# Patient Record
Sex: Male | Born: 1985 | Race: Black or African American | Hispanic: No | Marital: Single | Smoking: Never smoker
Health system: Southern US, Community
[De-identification: ages and names within clinical notes are randomized; demographics above are authoritative.]

---

## 2019-07-20 ENCOUNTER — Other Ambulatory Visit: Payer: Self-pay

## 2019-07-20 ENCOUNTER — Encounter (HOSPITAL_COMMUNITY): Payer: Self-pay | Admitting: Emergency Medicine

## 2019-07-20 ENCOUNTER — Emergency Department (HOSPITAL_COMMUNITY)
Admission: EM | Admit: 2019-07-20 | Discharge: 2019-07-20 | Disposition: A | Discharge status: Home / Self Care | Payer: BLUE CROSS/BLUE SHIELD | Attending: Emergency Medicine | Admitting: Emergency Medicine

## 2019-07-20 ENCOUNTER — Emergency Department (HOSPITAL_COMMUNITY): Payer: BLUE CROSS/BLUE SHIELD

## 2019-07-20 DIAGNOSIS — R0602 Shortness of breath: Secondary | ICD-10-CM | POA: Insufficient documentation

## 2019-07-20 DIAGNOSIS — R0789 Other chest pain: Secondary | ICD-10-CM | POA: Insufficient documentation

## 2019-07-20 DIAGNOSIS — R079 Chest pain, unspecified: Secondary | ICD-10-CM

## 2019-07-20 LAB — D-DIMER, QUANTITATIVE: D-Dimer, Quant: <0.27 ug/mL-FEU (ref 0.00–0.50)

## 2019-07-20 LAB — BASIC METABOLIC PANEL
Anion gap: 7 (ref 5–15)
BUN: 12 mg/dL (ref 6–20)
CO2: 25 mmol/L (ref 22–32)
Calcium: 9.7 mg/dL (ref 8.9–10.3)
Chloride: 107 mmol/L (ref 98–111)
Creatinine, Ser: 0.98 mg/dL (ref 0.61–1.24)
GFR calc Af Amer: >60 mL/min (ref >60)
GFR calc non Af Amer: >60 mL/min (ref >60)
Glucose, Bld: 98 mg/dL (ref 70–99)
Potassium: 4.1 mmol/L (ref 3.5–5.1)
Sodium: 139 mmol/L (ref 135–145)

## 2019-07-20 LAB — TROPONIN I (HIGH SENSITIVITY)
Troponin I (High Sensitivity): 6 ng/L (ref <18)
Troponin I (High Sensitivity): 6 ng/L (ref <18)

## 2019-07-20 LAB — CBC
HCT: 41.2 % (ref 39.0–52.0)
Hemoglobin: 14.3 g/dL (ref 13.0–17.0)
MCH: 30.4 pg (ref 26.0–34.0)
MCHC: 34.7 g/dL (ref 30.0–36.0)
MCV: 87.5 fL (ref 80.0–100.0)
Platelets: 264 10*3/uL (ref 150–400)
RBC: 4.71 MIL/uL (ref 4.22–5.81)
RDW: 14.1 % (ref 11.5–15.5)
WBC: 7.8 10*3/uL (ref 4.0–10.5)
nRBC: 0 % (ref 0.0–0.2)

## 2019-07-20 MED ORDER — IBUPROFEN 800 MG PO TABS: 800.0000 mg | ORAL_TABLET | Freq: Three times a day (TID) | ORAL | 0 refills | Status: AC | PRN

## 2019-07-20 MED ORDER — SODIUM CHLORIDE 0.9% FLUSH: 3.0000 mL | Freq: Once | INTRAVENOUS | Status: DC

## 2019-07-20 NOTE — ED Notes (Signed)
Blue, lt and dr green, lavender sent to lab.

## 2019-07-20 NOTE — ED Notes (Signed)
Patient transported to X-ray 

## 2019-07-20 NOTE — ED Notes (Signed)
Patient verbalizes understanding of discharge instructions. Opportunity for questioning and answers were provided. Armband removed by staff, pt discharged from ED.  

## 2019-07-20 NOTE — ED Triage Notes (Signed)
Pt here for evaluation of central chest pain with shob x 1 week. Pt sts pain is worse with exertion but still feels it intermittently when at rest.

## 2019-07-20 NOTE — ED Provider Notes (Signed)
[facility] EMERGENCY DEPARTMENT Provider Note   CSN: [id number] Arrival date & time: 07/20/19  1331     History   Chief Complaint Chief Complaint  Patient presents with  . Chest Pain    HPI Dennis Mullins is a 33 y.o. male.     HPI patient presents to the emergency department with chest pain is been ongoing for the last week.  Patient states he has had shortness of breath is been ongoing for quite a while.  The patient states that the chest pain is worse with certain movemechest pain, shortness of breath,nts.  Patient states did not take any medications prior to arrival for his symptoms.  The patient denies  headache,blurred vision, neck pain, fever, cough, weakness, numbness, dizziness, anorexia, edema, abdominal pain, nausea, vomiting, diarrhea, rash, back pain, dysuria, hematemesis, bloody stool, near syncope, or syncope. History reviewed. No pertinent past medical history.  There are no active problems to display for this patient.   History reviewed. No pertinent surgical history.      Home Medications    Prior to Admission medications   Not on File    Family History No family history on file.  Social History Social History   Tobacco Use  . Smoking status: Not on file  Substance Use Topics  . Alcohol use: Not on file  . Drug use: Not on file     Allergies   Patient has no known allergies.   Review of Systems Review of Systems All other systems negative except as documented in the HPI. All pertinent positives and negatives as reviewed in the HPI.  Physical Exam Updated Vital Signs BP 114/77   Pulse (!) 58   Temp 98.4 F (36.9 C) (Oral)   Resp 18   SpO2 99%   Physical Exam Vitals signs and nursing note reviewed.  Constitutional:      General: He is not in acute distress.    Appearance: He is well-developed.  HENT:     Head: Normocephalic and atraumatic.  Eyes:     Pupils: Pupils are equal, round, and reactive to light.   Neck:     Musculoskeletal: Normal range of motion and neck supple.  Cardiovascular:     Rate and Rhythm: Normal rate and regular rhythm.     Heart sounds: Normal heart sounds. No murmur. No friction rub. No gallop.   Pulmonary:     Effort: Pulmonary effort is normal. No respiratory distress.     Breath sounds: Normal breath sounds. No wheezing, rhonchi or rales.  Abdominal:     General: Bowel sounds are normal. There is no distension.     Palpations: Abdomen is soft.     Tenderness: There is no abdominal tenderness.  Skin:    General: Skin is warm and dry.     Capillary Refill: Capillary refill takes less than 2 seconds.     Findings: No erythema or rash.  Neurological:     Mental Status: He is alert and oriented to person, place, and time.     Motor: No abnormal muscle tone.     Coordination: Coordination normal.  Psychiatric:        Behavior: Behavior normal.      ED Treatments / Results  Labs (all labs ordered are listed, but only abnormal results are displayed) Labs Reviewed  BASIC METABOLIC PANEL  CBC  D-DIMER, QUANTITATIVE (NOT AT [facility])  TROPONIN I (HIGH SENSITIVITY)  TROPONIN I (HIGH SENSITIVITY)    EKG EKG  Interpretation  Date/Time:  Sunday July 20 2019 14:20:20 EDT Ventricular Rate:  76 PR Interval:    QRS Duration: 93 QT Interval:  445 QTC Calculation: 501 R Axis:   41 Text Interpretation:  Sinus rhythm Consider right atrial enlargement Consider right ventricular hypertrophy LVH by voltage ST elev, probable normal early repol pattern Prolonged QT interval Confirmed by Kennis CarinaBero, Michael [phone]([id number]) on 07/20/2019 2:54:53 PM   Radiology Dg Chest 2 View  Result Date: 07/20/2019 CLINICAL DATA:  Chest pain, shortness of breath EXAM: CHEST - 2 VIEW COMPARISON:  None. FINDINGS: The heart size and mediastinal contours are within normal limits. Both lungs are clear. The visualized skeletal structures are unremarkable. IMPRESSION: No acute abnormality of the lungs.  Electronically Signed   By: Lauralyn PrimesAlex  Bibbey M.D.   On: 07/20/2019 14:50    Procedures Procedures (including critical care time)  Medications Ordered in ED Medications  sodium chloride flush (NS) 0.9 % injection 3 mL (3 mLs Intravenous Not Given 07/20/19 1441)     Initial Impression / Assessment and Plan / ED Course  I have reviewed the triage vital signs and the nursing notes.  Pertinent labs & imaging results that were available during my care of the patient were reviewed by me and considered in my medical decision making (see chart for details).        The patient has atypical symptoms for cardiac chest pain.  Patient states that certain movements make his condition worse.  I feel that this is more pleuritic chest wall type pain.  Patient's d-dimer was negative which reassures us that there most likely is not a blood clot in his lung.  Patient also had 2- high-sensitivity troponins.  The patient's EKG does not show any acute changes as it was reviewed with cardiology earlier by the provider.  The patient is advised to return here as needed.  Patient denies any smoking history or cardiac type history in the past.  Final Clinical Impressions(s) / ED Diagnoses   Final diagnoses:  None    ED Discharge Orders    None       Charlestine NightLawyer, Errica Dutil, Cordelia Poche-C 07/20/19 1921    Tegeler, Canary Brimhristopher J, MD 07/21/19 0003

## 2019-07-20 NOTE — Discharge Instructions (Addendum)
Return here as needed.  Your testing here today did not show any significant abnormality.  Follow-up with your primary doctor.

## 2019-07-20 NOTE — ED Provider Notes (Signed)
MSE was initiated and I personally evaluated the patient and placed orders (if any) at  2:55 PM on July 20, 2019.  Patient here for chest pain x1 week, reports this is intermittent and worse with movement better with rest.  Has taken some ibuprofen without improvement.  Also endorses shortness of breath, this is worse with movement along with ambulation.  Reports that sitting at 90 degrees makes his pain worse.  No recent travel, sick contacts, denies any previous history of blood clots.  Patient does not have a history of CAD, diabetes, does not have any history of drug use or previous echo.  Physical Exam  Constitutional: He is oriented to person, place, and time. No distress.  HENT:  Head: Normocephalic and atraumatic.  Eyes: Pupils are equal, round, and reactive to light.  Neck: Normal range of motion. Neck supple.  Cardiovascular: Normal rate.  Pulmonary/Chest: Effort normal and breath sounds normal. He has no wheezes.  Abdominal: Soft. Bowel sounds are normal. There is no abdominal tenderness. There is no rebound and no guarding.  Neurological: He is oriented to person, place, and time.  Skin: Skin is warm and dry.  Nursing note and vitals reviewed.    The patient appears stable so that the remainder of the MSE may be completed by another provider.   Janeece Fitting, PA-C 07/20/19 1517    Maudie Flakes, MD 07/21/19 1655

## 2020-09-07 ENCOUNTER — Ambulatory Visit (INDEPENDENT_AMBULATORY_CARE_PROVIDER_SITE_OTHER): Payer: 59

## 2020-09-07 ENCOUNTER — Ambulatory Visit
Admission: EM | Admit: 2020-09-07 | Discharge: 2020-09-07 | Disposition: A | Discharge status: Home / Self Care | Payer: 59 | Attending: Emergency Medicine | Admitting: Emergency Medicine

## 2020-09-07 DIAGNOSIS — M79642 Pain in left hand: Secondary | ICD-10-CM | POA: Diagnosis not present

## 2020-09-07 DIAGNOSIS — S62613A Displaced fracture of proximal phalanx of left middle finger, initial encounter for closed fracture: Secondary | ICD-10-CM

## 2020-09-07 DIAGNOSIS — M545 Low back pain, unspecified: Secondary | ICD-10-CM

## 2020-09-07 NOTE — ED Provider Notes (Signed)
[facility]    CSN: [id number] Arrival date & time: 09/07/20  1836      History   Chief Complaint Chief Complaint  Patient presents with  . Finger Injury  . Back Pain    HPI Dennis Mullins is a 34 y.o. male  Presenting for left middle finger pain and swelling s/p fall that occurred Saturday.  Was playing flag football when a man landed on.  Endorsing mild bilateral low back pain that does not radiate as well.  No saddle anesthesia, lower leg numbness or weakness, urinary retention or fecal incontinence.  History reviewed. No pertinent past medical history.  There are no problems to display for this patient.   History reviewed. No pertinent surgical history.     Home Medications    Prior to Admission medications   Medication Sig Start Date End Date Taking? Authorizing Provider  ibuprofen (ADVIL) 800 MG tablet Take 1 tablet (800 mg total) by mouth every 8 (eight) hours as needed. 07/20/19   Charlestine Night, PA-C    Family History History reviewed. No pertinent family history.  Social History Social History   Tobacco Use  . Smoking status: Never Smoker  . Smokeless tobacco: Never Used  Substance Use Topics  . Alcohol use: Not Currently  . Drug use: Not Currently     Allergies   Patient has no known allergies.   Review of Systems Review of Systems  Constitutional: Negative for fatigue and fever.  Respiratory: Negative for cough and shortness of breath.   Cardiovascular: Negative for chest pain and palpitations.  Musculoskeletal:       Left middle finger pain, back pain.  Neurological: Negative for weakness and numbness.     Physical Exam Triage Vital Signs ED Triage Vitals  Enc Vitals Group     BP 09/07/20 1934 127/84     Pulse Rate 09/07/20 1934 61     Resp 09/07/20 1934 18     Temp 09/07/20 1934 98.4 F (36.9 C)     Temp Source 09/07/20 1934 Oral     SpO2 09/07/20 1934 97 %     Weight --      Height --      Head  Circumference --      Peak Flow --      Pain Score 09/07/20 1940 5     Pain Loc --      Pain Edu? --      Excl. in GC? --    No data found.  Updated Vital Signs BP 127/84 (BP Location: Left Arm)   Pulse 61   Temp 98.4 F (36.9 C) (Oral)   Resp 18   SpO2 97%   Visual Acuity Right Eye Distance:   Left Eye Distance:   Bilateral Distance:    Right Eye Near:   Left Eye Near:    Bilateral Near:     Physical Exam Constitutional:      General: He is not in acute distress. HENT:     Head: Normocephalic and atraumatic.  Eyes:     General: No scleral icterus.    Pupils: Pupils are equal, round, and reactive to light.  Cardiovascular:     Rate and Rhythm: Normal rate.  Pulmonary:     Effort: Pulmonary effort is normal. No respiratory distress.     Breath sounds: No wheezing.  Musculoskeletal:        General: Tenderness present. No swelling. Normal range of motion.     Comments: Left  hand, third MCP and extending into hand.  Neurovascularly intact. Diffuse lumbar tenderness without bony deformity, tenderness.  Negative SLR bilaterally.  Neurovascular intact  Skin:    Coloration: Skin is not jaundiced or pale.  Neurological:     Mental Status: He is alert and oriented to person, place, and time.      UC Treatments / Results  Labs (all labs ordered are listed, but only abnormal results are displayed) Labs Reviewed - No data to display  EKG   Radiology DG Hand Complete Left  Result Date: 09/07/2020 CLINICAL DATA:  Pain EXAM: LEFT HAND - COMPLETE 3+ VIEW COMPARISON:  None. FINDINGS: There is an acute, minimally displaced fracture through the head of the third metacarpal. There is surrounding soft tissue swelling. There is no dislocation. IMPRESSION: Acute, minimally displaced fracture through the head of the third metacarpal with surrounding soft tissue swelling. Electronically Signed   By: Katherine Mantle M.D.   On: 09/07/2020 19:57    Procedures Procedures  (including critical care time)  Medications Ordered in UC Medications - No data to display  Initial Impression / Assessment and Plan / UC Course  I have reviewed the triage vital signs and the nursing notes.  Pertinent labs & imaging results that were available during my care of the patient were reviewed by me and considered in my medical decision making (see chart for details).     XR significant for acute, minimally displaced fracture through the head of the third metacarpal with surrounding soft tissue swelling.  Patient agreeable with finger static splint, buddy taping, following up with Ortho tomorrow morning.  Return precautions discussed, pt verbalized understanding and is agreeable to plan. Final Clinical Impressions(s) / UC Diagnoses   Final diagnoses:  Acute bilateral low back pain without sciatica  Closed displaced fracture of proximal phalanx of left middle finger, initial encounter   Discharge Instructions   None    ED Prescriptions    None     PDMP not reviewed this encounter.   Hall-Potvin, [place], [place] 09/07/20 2030

## 2020-09-07 NOTE — ED Triage Notes (Signed)
Pt states playing flag football on Saturday and a man landed on him. Pt c/o jammed lt middle finger and lower back pain. Denies pain radiating or loss of bowel or bladder.

## 2022-02-06 IMAGING — DX DG HAND COMPLETE 3+V*L*
3 series · 3 of 3 positions shown · non-contrast
Comparison: None.

CLINICAL DATA: Pain

EXAM:
LEFT HAND - COMPLETE 3+ VIEW

[hand pa]
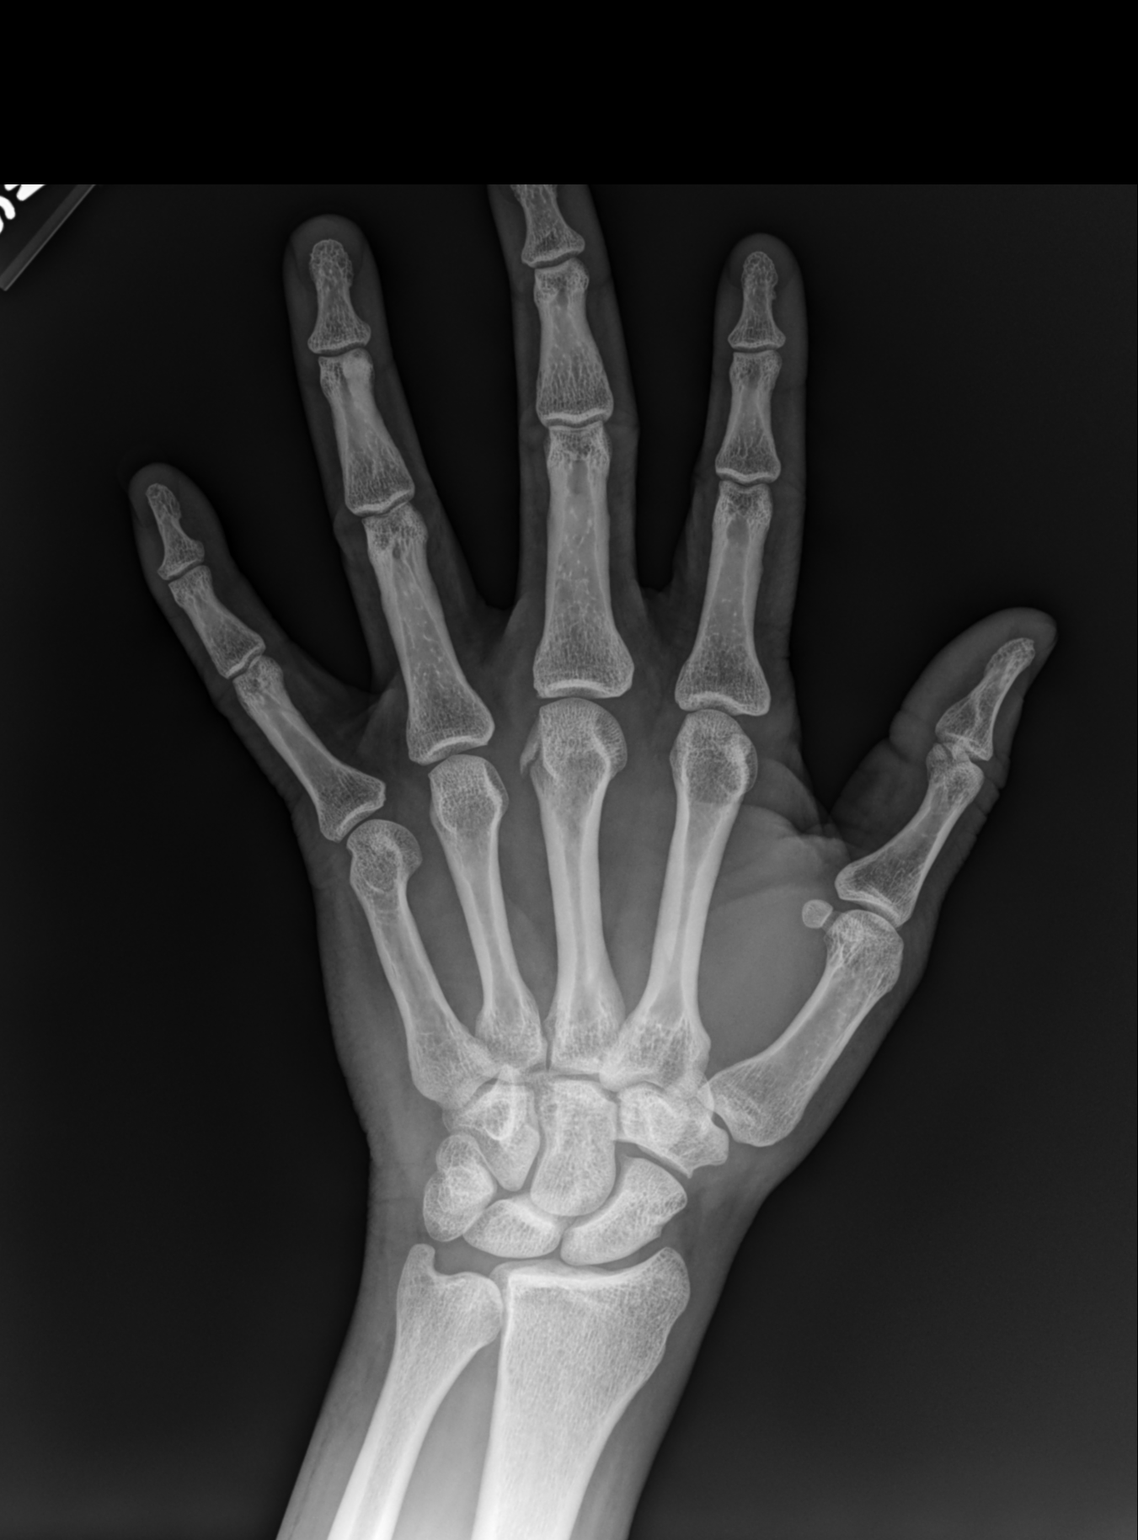

[hand mlo]
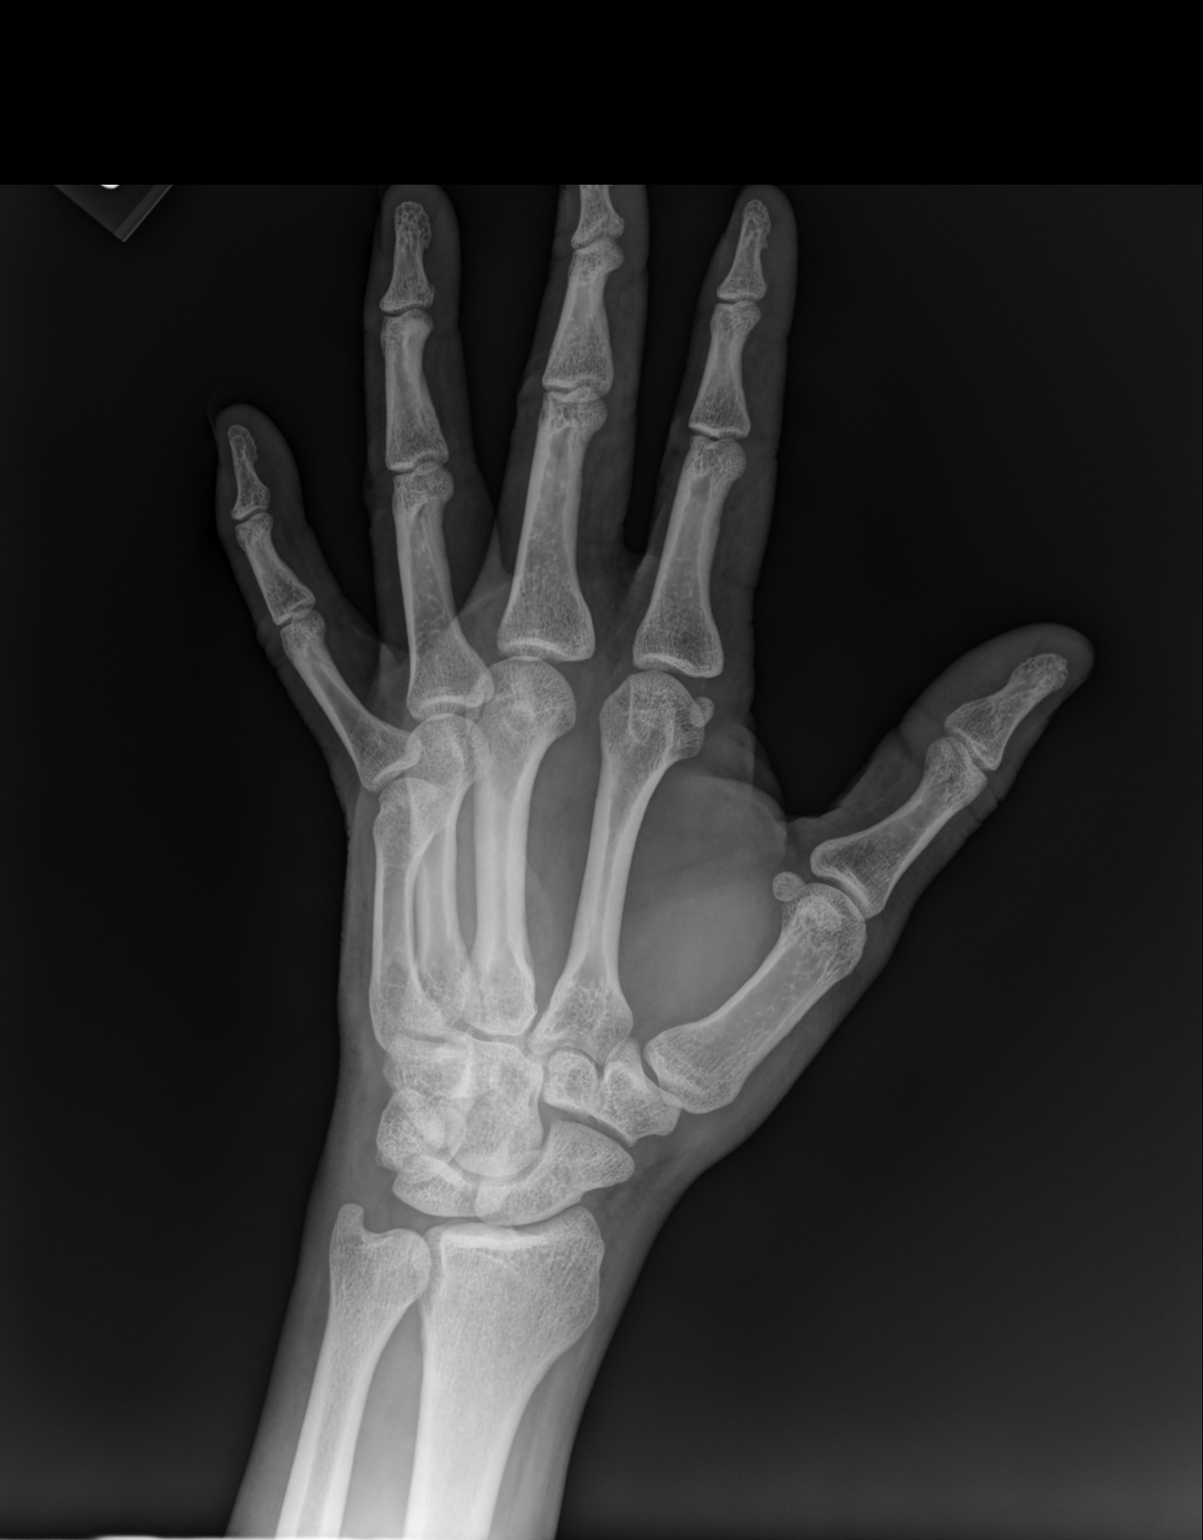

[hand lat]
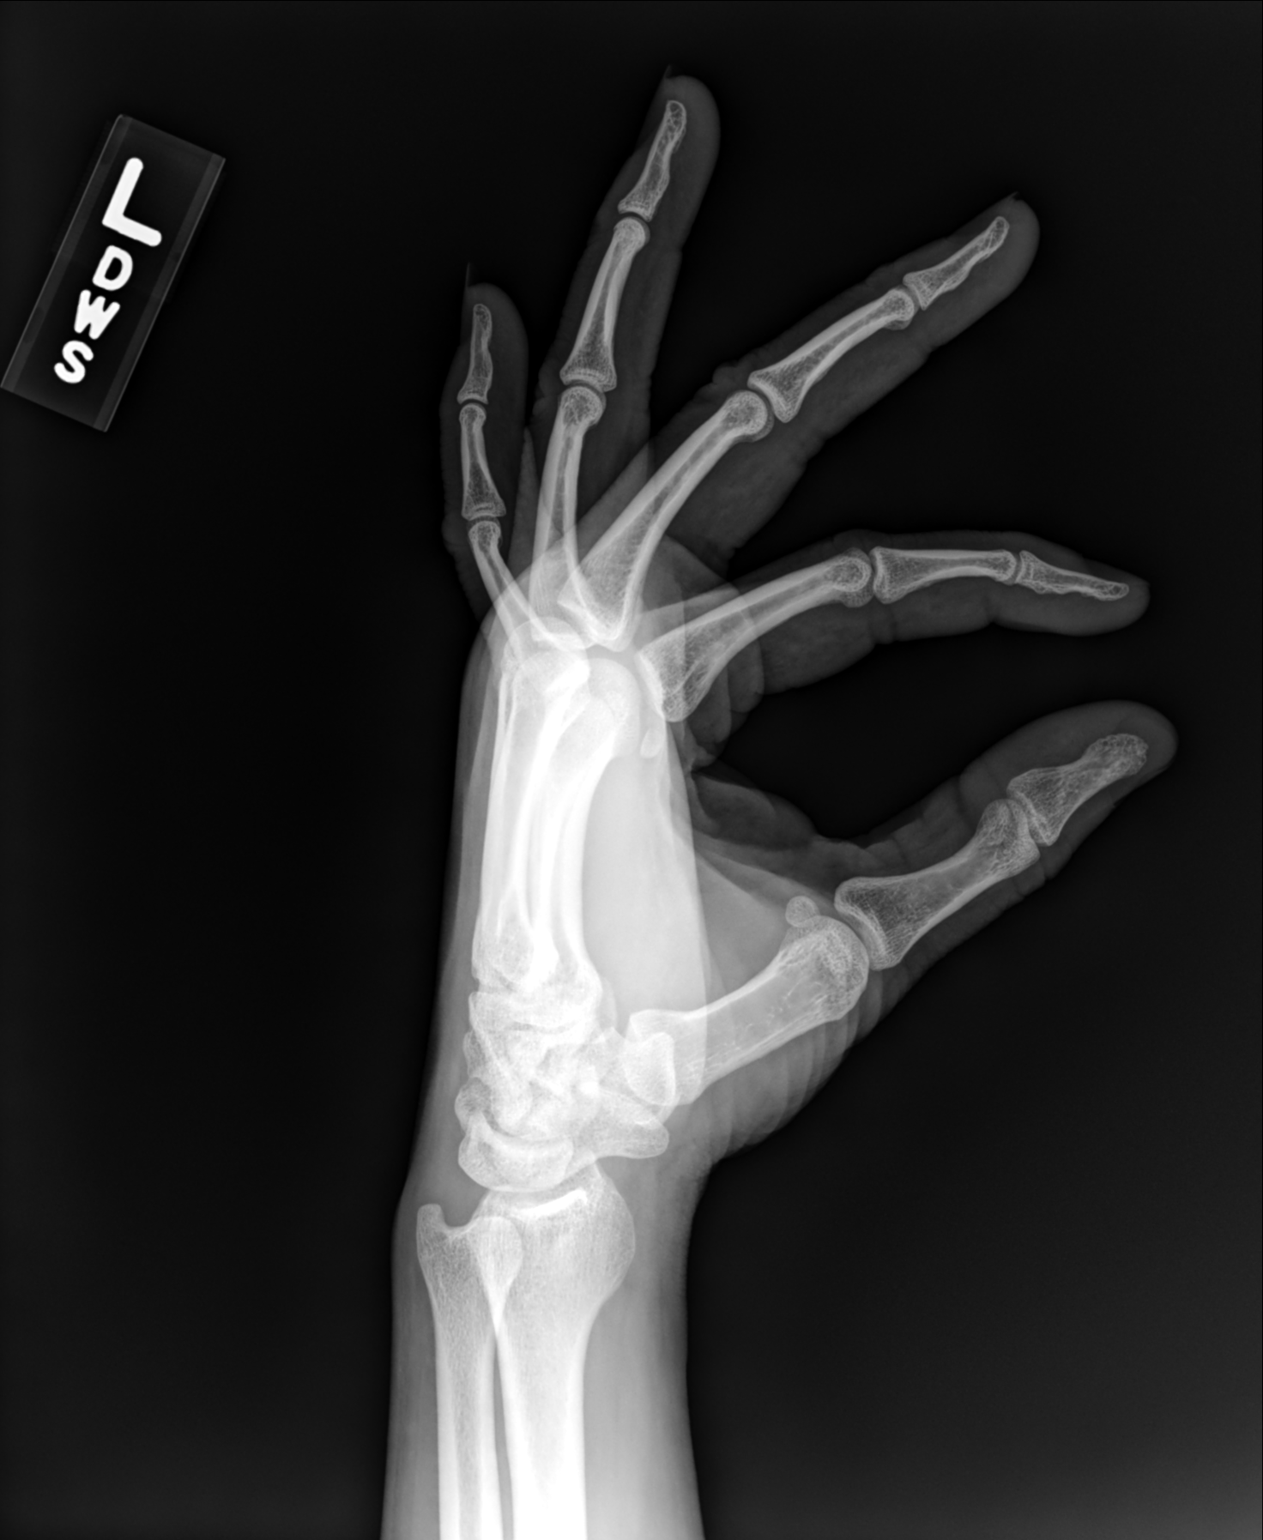

[3 of 3 positions shown; findings below may reference images not displayed]

FINDINGS: There is an acute, minimally displaced fracture through the head of
the third metacarpal. There is surrounding soft tissue swelling.
There is no dislocation.
IMPRESSION: Acute, minimally displaced fracture through the head of the third
metacarpal with surrounding soft tissue swelling.

## 2024-10-22 ENCOUNTER — Ambulatory Visit: Payer: Self-pay | Admitting: Sports Medicine

## 2024-11-11 ENCOUNTER — Encounter: Payer: Self-pay | Admitting: Sports Medicine

## 2024-11-11 ENCOUNTER — Other Ambulatory Visit: Payer: Self-pay

## 2024-11-11 ENCOUNTER — Ambulatory Visit: Payer: Self-pay | Admitting: Sports Medicine

## 2024-11-11 DIAGNOSIS — M76812 Anterior tibial syndrome, left leg: Secondary | ICD-10-CM | POA: Diagnosis not present

## 2024-11-11 DIAGNOSIS — M79662 Pain in left lower leg: Secondary | ICD-10-CM

## 2024-11-11 NOTE — Progress Notes (Signed)
 "  Dennis Mullins - 39 y.o. male MRN [id number]  Date of birth: 1986-08-04  Office Visit Note: Visit Date: 11/11/2024 PCP: Patient, No Pcp Per Referred by: No ref. provider found  Subjective: Chief Complaint  Patient presents with   Left Leg - Pain   HPI: Dennis Mullins is a pleasant 39 y.o. male who presents today for chronic left shin/leg pain x 1 year.  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  Discussed the use of AI scribe software for clinical note transcription with the patient, who gave verbal consent to proceed.  History of Present Illness Dennis Mullins is a 39 year old male with chronic left tibialis anterior tendinopathy who presents for evaluation of persistent left lower leg pain.  For about a year he has had intermittent nagging, aching pain along the lateral left tibia from just below the knee to the ankle. 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  He tolerates the pain without limiting daily activities or athletic performance and denies sharp pain, trauma, swelling, erythema, numbness, tingling, or weakness.  He avoids oral analgesics. He uses ice and a TENS unit at night with partial relief. Symptoms have been stable without acute changes.  He has not had prior surgery or procedures for this problem.   Pertinent ROS were reviewed with the patient and found to be negative unless otherwise specified above in HPI.   Assessment & Plan: Visit Diagnoses:  1. Pain in left shin   2. Anterior tibialis tendinitis of left leg    Assessment & Plan Left tibialis anterior tendinopathy Chronic, activity-related pain in the left tibia due to tibialis anterior tendinopathy. No acute injury, stress fracture, or shin splints. Attributed to overuse and running mechanics - forefoot striker, excessive vertical osscilation. Benign course with low risk for  further injury. - Referred to physical therapy for targeted stretching and strengthening for PT tendon and gait mechanics. - Instructed to attend 2-3 physical therapy sessions for education with likely transition to home exercise program and functional modifications. - Provided reassurance regarding benign nature and low risk for further injury. - Advised to report any worsening or persistence of symptoms beyond 6-8 weeks after therapy initiation. -May benefit from shoe with lower instep or moderate heel drop - Okay for ibuprofen  and/or Tylenol for breakthrough pain only as needed  Follow-up: Return if symptoms worsen or fail to improve.   Meds & Orders: No orders of the defined types were placed in this encounter.   Orders Placed This Encounter  Procedures   XR Tibia/Fibula Left   Ambulatory referral to Physical Therapy     Procedures: No procedures performed      Clinical History: No specialty comments available.  He reports that he has never smoked. He has never used smokeless tobacco. No results for input(s): HGBA1C, LABURIC in the last 8760 hours.  Objective:   Physical Exam  Gen: Well-appearing, in no acute distress; non-toxic CV: Well-perfused. Warm.  Resp: Breathing unlabored on room air; no wheezing. Psych: Fluid speech in conversation; appropriate affect; normal thought process  *MSK/Ortho Exam: Physical Exam MUSCULOSKELETAL: Palpation of the tibia/fibula does not elicit pain. Resisted dorsiflexion of the foot elicits sensation in the leg. Plantarflexion, inversion, and eversion of the foot do not elicit pain.  Negative hop test.  Able to perform heel raise and toe raise without difficulty.  Gait analysis shows predominant forefoot striker.  There  is a degree of left-sided minimal tibial inversion with resistance with heel eversion compared to the contralateral side which moves normally.  Imaging: XR Tibia/Fibula Left Result Date: 11/11/2024 2 views including  proximal and distal views of the tibia and fibula, AP and lateral film were ordered and reviewed by myself today.  X-rays demonstrate no cortical regularity.  There is no abnormality of the medullary cortex.  Neutral alignment, no arthropathy of the knee or ankle on limited views.   Past Medical/Family/Surgical/Social History: Medications & Allergies reviewed per EMR, new medications updated. There are no active problems to display for this patient.  No past medical history on file. No family history on file. No past surgical history on file. Social History   Occupational History   Not on file  Tobacco Use   Smoking status: Never   Smokeless tobacco: Never  Substance and Sexual Activity   Alcohol use: Not Currently   Drug use: Not Currently   Sexual activity: Not on file   "

## 2024-11-11 NOTE — Progress Notes (Signed)
 Patient states that he has had left shin pain for about a year. He points on his anterior tibialis when describing his pain. Patient says that the nagging/aching pain occurs during and after flag football practices, he also feels the pain while resting. He can feel the pain while doing ankle dorsiflexion and plantarflexion. He feels more of a stretch with inversion. Patient mentions that icing and e-stim did help to reduce the pain. -PS

## 2024-11-19 NOTE — Therapy (Incomplete)
 " OUTPATIENT PHYSICAL THERAPY LOWER EXTREMITY EVALUATION   Patient Name: Dennis Mullins MRN: [id number] DOB:04-12-1986, 39 y.o., male Today's Date: 11/20/2024  END OF SESSION:  PT End of Session - 11/20/24 0851     Visit Number 1    Number of Visits 5    Date for Recertification  02/06/25    Authorization Type [employer]    Authorization Time Period $72 COPAY    PT Start Time 0800    PT Stop Time 0849    PT Time Calculation (min) 49 min    Activity Tolerance Patient tolerated treatment well    Behavior During Therapy [facility] for tasks assessed/performed          History reviewed. No pertinent past medical history. History reviewed. No pertinent surgical history. There are no active problems to display for this patient.   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 He is a runner and is experiencing an aching pain in left lateral sin area for over a year now. He saw Dr. Burnetta who did not see any bone or muscle concerns. Dr. Burnetta referred to PT to address pain with activities.   PERTINENT HISTORY: Remote history of acute bilateral low back pain without sciatica  DIAGNOSTIC FINDINGS:  XR Tibia/Fibula Left Result Date: 11/11/2024 2 views including proximal and distal views of the tibia and fibula, AP and lateral film were ordered and reviewed by myself  today.  X-rays demonstrate no cortical regularity.  There is no abnormality of the medullary cortex.  Neutral alignment, no arthropathy of the knee or ankle on limited views.  PAIN:  NPRS scale: Since this week a 7-8/10; lowest 1/10  (Laying on stomach and either side increases pain; laying on back decreases pain)  Pain location: Lateral shin area  Pain description: Aching pain,  Aggravating factors: Flag football, running on various surfaces  Relieving factors: Laying on back, ice, cool gel, sticky tape  PRECAUTIONS: None  WEIGHT BEARING RESTRICTIONS: No  FALLS:  Has patient fallen in last 6 months? 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 10 represents able to perform at prior level. 0 1 2 3 4 5 6 7 8 9  10 (Date and Score)  Activity Eval  11/20/24    1. Workout  4    2. Flag football 4     3.     4.    5.    Score 4    Total score = sum of the  activity scores/number of activities Minimum detectable change (90%CI) for average score = 2 points Minimum detectable change (90%CI) for single activity score = 3 points  COGNITION: Overall cognitive status: WFL    SENSATION: WFL  POSTURE:  No Significant postural limitations  LOWER EXTREMITY ROM:   ROM Right eval Left eval  Hip flexion    Hip extension    Hip abduction    Hip adduction    Hip internal rotation WNL Seated A: 20*  Hip external rotation WNL Seated A:15*  Knee flexion    Knee extension    Ankle dorsiflexion Seated Knee flexed A: 10* Knee ext A: 4*  Seated Knee flexed A: 9* Knee ext A: 4*  Ankle plantarflexion    Ankle inversion Prone knee flex P: 22* A: 13* Prone knee flex P: 21* A: 19*  Ankle eversion Prone knee flex P: 9* A: 12* Prone knee flex P: 11* A: 8*   (Blank rows = not tested)  LOWER EXTREMITY MMT:  MMT Right eval Left eval  Hip flexion    Hip extension  4/5  Hip abduction  5/5  Hip adduction    Hip internal rotation  4/5  Hip external rotation  4/5  Knee flexion  5/5  Knee extension    Ankle dorsiflexion  4/5  Ankle plantarflexion    Ankle inversion  5/5  Ankle eversion  4/5   (Blank rows = not tested)  FUNCTIONAL TESTS:  18 inch chair transfer: Patient is able to Sit<>stand independently without the use of BUE support or a device.  GAIT:  Distance walked: >500'  Gait velocity Comfortable: 3.33 ft/sec  Fast: 4.96 ft/sec Assistive device utilized: None Level of assistance: no assistance but needs cues to address abnormalities causing pain Walking gait analysis: LLE has early heel rise in gait cycle followed by push off that has greater vertical rise than typical (should be more forward propulsion), LLE has slight adduction in step width with toe-in foot placement, RLE has slight decreased step length (due to LLE stance deviaitions), decreased pelvic & hip rotation slightly on LLE. Step thru gait pattern.  Running gait analysis: patient has running pattern with initial contact with forefoot (normal for large percent of runners).  He has magnification of above walking gait deviations including increased vertical lift to center of mass.   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 Pseudo in that he is not up on toes as large of percent of gait cycle that a true or full toe walker exhibits.                                                                                                                                                    TODAY'S TREATMENT  DATE:11/20/24 Therapeutic Exercise: HEP instruction/performance c cues for techniques, handout provided.  Trial set performed of each for comprehension and symptom assessment.  See below for exercise list  PATIENT EDUCATION:  Education details: HEP, POC Person educated: Patient Education method: Explanation, Demonstration, Verbal cues, and Handouts Education comprehension: verbalized understanding, returned demonstration, and verbal cues required  HOME EXERCISE PROGRAM: Access Code: [email or web address] URL: [email or web address] Date: 11/20/2024 Prepared by: Sherline Babe  Exercises - Supine Hip External Rotation Stretch  - 1-2 x daily - 7 x weekly - 1 sets - 3 reps - 20 sec hold - Supine Bilateral Hip Internal Rotation Stretch  - 1-2 x daily - 7 x weekly - 1 sets - 3 reps - 20 sec hold - Seated Hip Internal Rotation with Ball and Resistance  - 1 x daily - 7 x weekly - 3 sets - 20 reps - 3-5 hold - Seated Hip External Rotation with Resistance  - 1 x daily - 7 x weekly - 3 sets - 20 reps - 3-5 hold  ASSESSMENT: CLINICAL IMPRESSION: Patient is a 39 y.o. who comes to clinic with complaints chronic left shin/leg pain with mobility, strength and movement coordination deficits that impair their ability to perform usual daily and recreational functional activities without increase difficulty/symptoms at this time. Appears that patients pain is a result of constant anterior tibialis muscle activation through a greater range against gravity than in normal gait due to vertical toe walking. Additionally, patient has proximal hip control and ROM deficits contributing to LE misalignment mechanics during gait. Patient to benefit from skilled PT services to address impairments and limitations to improve to previous level of function without restriction secondary to condition.   OBJECTIVE IMPAIRMENTS: Abnormal gait, decreased knowledge of condition, decreased mobility, decreased ROM,  decreased strength, and pain.   ACTIVITY LIMITATIONS: carrying, lifting, bending, standing, squatting, sleeping, stairs, and locomotion level  PARTICIPATION LIMITATIONS: community activity and occupation  PERSONAL FACTORS: Profession and Time since onset of injury/illness/exacerbation are also affecting patient's functional outcome.   REHAB POTENTIAL: Good  CLINICAL DECISION MAKING: Stable/uncomplicated  EVALUATION COMPLEXITY: Low   GOALS: Goals reviewed with patient? Yes  SHORT TERM GOALS: (target date for Short term goals 12/12/24)   1.  Patient will demonstrate independent use of home exercise program to maintain progress from in clinic treatments. Goal status: New  LONG TERM GOALS: (target dates for all long term goals  02/06/25 )   1. Patient will demonstrate/report pain at worst less than or equal to 2/10 to facilitate minimal limitation in daily activity secondary to pain symptoms. Goal status: New   2. Patient will demonstrate independent use of home exercise program to facilitate ability to maintain/progress functional gains from skilled physical therapy services. Goal status: New   3. Patient will demonstrate Patient specific functional scale avg > or = 7 to indicate reduced disability due to condition.  Goal status: New   4.  Patient will demonstrate Left LE MMT 5/5 throughout to faciltiate usual transfers, stairs, squatting at [facility] for daily life.  Goal status: New   5.  Patient will demonstrate symmetry in hip rotation motions in seated to improve stance control and toe mechanics during walking and jogging.  Goal status: New  PLAN:  PT FREQUENCY: 1 time every 2/wk   PT DURATION: 5 visits over 11/wks  PLANNED INTERVENTIONS: 97750- Physical Performance Testing, 97110-Therapeutic exercises, 97530- Therapeutic activity, 97112- Neuromuscular re-education, 97535- Self Care, [id number]- Manual therapy, [phone]- Gait training, Patient/Family education, Balance training,  Stair  training, Taping, Cryotherapy, and Moist heat  PLAN FOR NEXT SESSION: Follow up with HEP progressing into functional positions / motions including Continue functional hip stretching and strengthening. Gait training working on heel rise and push-off with LLE.   Sherline Babe, Student-PT 11/20/2024, 10:07 AM  This entire session of physical therapy was performed under the direct supervision of PT signing evaluation /treatment. PT reviewed note and agrees.   Grayce Spatz, PT, DPT 11/20/2024, 2:28 PM      "

## 2024-11-20 ENCOUNTER — Ambulatory Visit: Admitting: Physical Therapy

## 2024-11-20 ENCOUNTER — Encounter: Payer: Self-pay | Admitting: Physical Therapy

## 2024-11-20 DIAGNOSIS — R2689 Other abnormalities of gait and mobility: Secondary | ICD-10-CM

## 2024-11-20 DIAGNOSIS — M25652 Stiffness of left hip, not elsewhere classified: Secondary | ICD-10-CM | POA: Diagnosis not present

## 2024-11-20 DIAGNOSIS — M6281 Muscle weakness (generalized): Secondary | ICD-10-CM | POA: Diagnosis not present

## 2024-11-20 DIAGNOSIS — M79662 Pain in left lower leg: Secondary | ICD-10-CM
